# Patient Record
Sex: Male | Born: 2000 | Race: White | Hispanic: No | Marital: Single | Smoking: Never smoker
Health system: Southern US, Community
[De-identification: ages and names within clinical notes are randomized; demographics above are authoritative.]

## PROBLEM LIST (undated history)

## (undated) DIAGNOSIS — D6800 Von Willebrand disease, unspecified: Secondary | ICD-10-CM

---
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
## 2021-08-06 ENCOUNTER — Ambulatory Visit (HOSPITAL_COMMUNITY)
Admission: EM | Admit: 2021-08-06 | Discharge: 2021-08-06 | Disposition: A | Discharge status: Home / Self Care | Payer: No Payment, Other

## 2021-08-06 DIAGNOSIS — F1311 Sedative, hypnotic or anxiolytic abuse, in remission: Secondary | ICD-10-CM

## 2021-08-06 DIAGNOSIS — F1121 Opioid dependence, in remission: Secondary | ICD-10-CM
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
## 2021-08-06 NOTE — Discharge Instructions (Addendum)
Patient is instructed prior to discharge to:  Take all medications as prescribed by his/her mental healthcare provider. Report any adverse effects and or reactions from the medicines to his/her outpatient provider promptly. Keep all scheduled appointments, to ensure that you are getting refills on time and to avoid any interruption in your medication.  If you are unable to keep an appointment call to reschedule.  Be sure to follow-up with resources and follow-up appointments provided.  Patient has been instructed & cautioned: To not engage in alcohol and or illegal drug use while on prescription medicines. In the event of worsening symptoms, patient is instructed to call the crisis hotline, 911 and or go to the nearest ED for appropriate evaluation and treatment of symptoms. To follow-up with his/her primary care provider for your other medical issues, concerns and or health care needs.    Substance Abuse Treatment Resources listed Below:  [facility] - Admissions are currently completed Monday through Friday at 8am; both appointments and walk-ins are accepted.  Any individual that is a [place] resident may present for a substance abuse screening and assessment for admission.  A person may be referred by numerous sources or self-refer.   Potential clients will be screened for medical necessity and appropriateness for the program.  Clients must meet criteria for high-intensity residential treatment services.  If clinically appropriate, a client will continue with the comprehensive clinical assessment and intake process, as well as enrollment in the [facility].  Address: [address] [place], [place] [address] Admin Hours: Mon-Fri 8AM to 5PM Center Hours: 24/7 Phone: [phone] Fax: [phone]  [facility] Address: [address], [place], [place] [address] Behavioral Health Urgent Care (BHUC) Hours: 24/7 Phone:  [phone] Fax: [phone]  [facility] ([facility]): (offers outpatient therapy and intensive outpatient substance abuse therapy).  [address], [place], [place] [address] Phone: [phone]  [facility]: Offers FREE recovery skills classes, support groups, 1:1 Peer Support, and Compeer Classes. [address], [place], [place] [address] Phone: [phone] (Call to complete intake).   [facility] [address] [place], [place] [address] Phone: [phone] ext [phone] [facility] provides food, shelter and other programs and services to the homeless men of [place] through our men's program.  By offering safe shelter, three meals a day, clean clothing, Biblical counseling, financial planning, vocational training, GED/education and employment assistance, we've helped mend the shattered lives of many homeless men since opening in 1974.  We have approximately 267 beds available, with a max of 312 beds including mats for emergency situations and currently house an average of 270 men a night.  Prospective Client Check-In Information Photo ID Required (State/ Out of State/ DOC) - if photo ID is not available, clients are required to have a printout of a police/sheriff's criminal history report. Help out with chores around the Mission. No sex offender of any type (pending, charged, registered and/or any other sex related offenses) will be permitted to check in. Must be willing to abide by all rules, regulations, and policies established by the [facility]. The following will be provided - shelter, food, clothing, and biblical counseling. If you or someone you know is in need of assistance at our men's shelter in [place], [place], please call [phone] ext. [phone].  [facility]-will provide timely access to mental health services for children and adolescents (4-17) and adults    presenting in a mental health crisis. The program is designed for those who need urgent Behavioral Health or Substance Use treatment and are not experiencing a medical crisis that would typically require an emergency room visit.    [address] [place], [place] [address] Phone: [phone] [email or web address]  [facility]: Phone#: [phone]  The [facility] SA Intensive Outpatient Program (SAIOP) means structured individual and group addiction activities and services that are provided at an outpatient program designed to assist adult and adolescent consumers to begin recovery and learn skills for recovery maintenance. The [facility] SAIOP program is offered at least 3 hours a day, 3 days a week.SAIOP services shall include a structured program consisting of, but not limited to, the following services: Individual counseling and support; Group counseling and support; Family counseling, training or support; Biochemical assays to identify recent drug use (e.g., urine drug screens); Strategies for relapse prevention to include community and social support systems in treatment; Life skills; Crisis contingency planning; Disease Management; and Treatment support activities that have been adapted or specifically designed for persons with physical disabilities, or persons with co-occurring disorders of mental illness and substance abuse/dependence or mental retardation/developmental disability and substance abuse/dependence. Phone: [phone]  Address:   The [place] [facility] will also offer the following outpatient services: (Monday through Friday 8am-5pm)   Partial Hospitalization Program (PHP) Substance Abuse Intensive Outpatient Program (SA-IOP) Group Therapy Medication Management Peer Living Room We also provide (24/7):  Assessments: Our mental health clinician and providers will conduct a focused mental health evaluation, assessing for immediate  safety concerns and further mental health needs. Referral: Our team will provide resources and help connect to community based mental health treatment, when indicated, including psychotherapy, psychiatry, and other specialized behavioral health or substance use disorder services (for those not already in treatment). Transitional Care: Our team providers in person bridging and/or telephonic follow-up during the patient's transition to outpatient services.  [facility] 24-Hour Call Center: [phone] Behavioral Health Crisis Line: [phone]  

## 2021-08-06 NOTE — Progress Notes (Signed)
Gregory Orozco received his AVS, questions answered and his personal items were retrieved. He was escorted to the lobby. ?

## 2021-09-03 ENCOUNTER — Telehealth: Payer: Self-pay | Admitting: Physician Assistant

## 2021-09-03 NOTE — Telephone Encounter (Signed)
Copied from CRM [id number]. Topic: General - Deceased Patient ?>> 10-02-2021  1:00 PM Pawlus, Maxine Glenn A wrote: ?Caller from [facility] services contact the office to cancel the pts appt with [facility] due to the pt passing away FYI ?

## 2021-09-04 ENCOUNTER — Ambulatory Visit (HOSPITAL_COMMUNITY)
Admission: EM | Admit: 2021-09-04 | Discharge: 2021-09-04 | Disposition: A | Discharge status: Home / Self Care | Payer: No Payment, Other | Attending: Psychiatry | Admitting: Psychiatry

## 2021-09-04 DIAGNOSIS — Z5901 Sheltered homelessness: Secondary | ICD-10-CM | POA: Insufficient documentation

## 2021-09-04 DIAGNOSIS — F1121 Opioid dependence, in remission: Secondary | ICD-10-CM

## 2021-09-04 DIAGNOSIS — G4700 Insomnia, unspecified: Secondary | ICD-10-CM | POA: Insufficient documentation

## 2021-09-04 DIAGNOSIS — F1311 Sedative, hypnotic or anxiolytic abuse, in remission: Secondary | ICD-10-CM
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
## 2021-09-04 NOTE — Discharge Instructions (Addendum)
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
## 2021-09-05 ENCOUNTER — Encounter (HOSPITAL_COMMUNITY): Payer: Self-pay

## 2021-09-05 ENCOUNTER — Emergency Department (HOSPITAL_COMMUNITY)
Admission: EM | Admit: 2021-09-05 | Discharge: 2021-09-05 | Disposition: A | Discharge status: Home / Self Care | Payer: Medicaid Other | Attending: Emergency Medicine | Admitting: Emergency Medicine

## 2021-09-05 ENCOUNTER — Other Ambulatory Visit: Payer: Self-pay

## 2021-09-05 DIAGNOSIS — Z59812 Housing instability, housed, homelessness in past 12 months: Secondary | ICD-10-CM

## 2021-09-05 NOTE — Discharge Instructions (Addendum)
At this time there does not appear to be the presence of an emergent medical condition, however there is always the potential for conditions to change. Please read and follow the below instructions. ? ?Please return to the Emergency Department immediately for any new or worsening symptoms. ?Please be sure to follow up with your Primary Care Provider within one week regarding your visit today; please call their office to schedule an appointment even if you are feeling better for a follow-up visit. ?Please follow-up with your resource guide given to you by the behavioral health urgent care. ? ? ?Please read the additional information packets attached to your discharge summary. ? ?Do not take your medicine if  develop an itchy rash, swelling in your mouth or lips, or difficulty breathing; call 911 and seek immediate emergency medical attention if this occurs. ? ?You may review your lab tests and imaging results in their entirety on your MyChart account.  Please discuss all results of fully with your primary care provider and other specialist at your follow-up visit. ? ?Note: Portions of this text may have been transcribed using voice recognition software. Every effort was made to ensure accuracy; however, inadvertent computerized transcription errors may still be present. ? ?
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
## 2021-09-05 NOTE — ED Notes (Signed)
Discharged at triage by PA 
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
## 2021-09-06 ENCOUNTER — Ambulatory Visit: Payer: Self-pay | Admitting: Physician Assistant

## 2021-09-06 NOTE — Telephone Encounter (Signed)
MA spoke with Gregory Orozco at [facility] and confirmed patient was discharged on 4/12. Patient has been in the ED on 4/15 and Sep 28, 2022. Patient is NOT DECEASED.

## 2021-10-07 ENCOUNTER — Emergency Department (HOSPITAL_COMMUNITY): Payer: Self-pay

## 2021-10-07 ENCOUNTER — Emergency Department (HOSPITAL_COMMUNITY)
Admission: EM | Admit: 2021-10-07 | Discharge: 2021-10-07 | Disposition: A | Discharge status: Home / Self Care | Payer: Self-pay | Attending: Emergency Medicine | Admitting: Emergency Medicine

## 2021-10-07 DIAGNOSIS — W01198A Fall on same level from slipping, tripping and stumbling with subsequent striking against other object, initial encounter: Secondary | ICD-10-CM | POA: Insufficient documentation

## 2021-10-07 DIAGNOSIS — W57XXXA Bitten or stung by nonvenomous insect and other nonvenomous arthropods, initial encounter: Secondary | ICD-10-CM

## 2021-10-07 DIAGNOSIS — R251 Tremor, unspecified: Secondary | ICD-10-CM | POA: Insufficient documentation

## 2021-10-07 DIAGNOSIS — R55 Syncope and collapse: Secondary | ICD-10-CM | POA: Insufficient documentation

## 2021-10-07 DIAGNOSIS — R569 Unspecified convulsions: Secondary | ICD-10-CM | POA: Insufficient documentation

## 2021-10-07 DIAGNOSIS — S90561A Insect bite (nonvenomous), right ankle, initial encounter: Secondary | ICD-10-CM | POA: Insufficient documentation

## 2021-10-07 DIAGNOSIS — R410 Disorientation, unspecified: Secondary | ICD-10-CM | POA: Insufficient documentation

## 2021-10-07 DIAGNOSIS — R531 Weakness: Secondary | ICD-10-CM | POA: Insufficient documentation

## 2021-10-07 DIAGNOSIS — R404 Transient alteration of awareness: Secondary | ICD-10-CM

## 2021-10-07 DIAGNOSIS — R4189 Other symptoms and signs involving cognitive functions and awareness: Secondary | ICD-10-CM

## 2021-10-07 LAB — COMPREHENSIVE METABOLIC PANEL
ALT: 20 U/L (ref 0–44)
AST: 24 U/L (ref 15–41)
Albumin: 4.5 g/dL (ref 3.5–5.0)
Alkaline Phosphatase: 50 U/L (ref 38–126)
Anion gap: 8 (ref 5–15)
BUN: 12 mg/dL (ref 6–20)
CO2: 23 mmol/L (ref 22–32)
Calcium: 9.2 mg/dL (ref 8.9–10.3)
Chloride: 105 mmol/L (ref 98–111)
Creatinine, Ser: 0.83 mg/dL (ref 0.61–1.24)
GFR, Estimated: >60 mL/min (ref >60)
Glucose, Bld: 83 mg/dL (ref 70–99)
Potassium: 3.7 mmol/L (ref 3.5–5.1)
Sodium: 136 mmol/L (ref 135–145)
Total Bilirubin: 0.3 mg/dL (ref 0.3–1.2)
Total Protein: 7.3 g/dL (ref 6.5–8.1)

## 2021-10-07 LAB — CBC
HCT: 38.2 % — ABNORMAL LOW (ref 39.0–52.0)
Hemoglobin: 13.3 g/dL (ref 13.0–17.0)
MCH: 30.9 pg (ref 26.0–34.0)
MCHC: 34.8 g/dL (ref 30.0–36.0)
MCV: 88.8 fL (ref 80.0–100.0)
Platelets: 342 10*3/uL (ref 150–400)
RBC: 4.3 MIL/uL (ref 4.22–5.81)
RDW: 13.3 % (ref 11.5–15.5)
WBC: 11.4 10*3/uL — ABNORMAL HIGH (ref 4.0–10.5)
nRBC: 0 % (ref 0.0–0.2)

## 2021-10-07 MED ORDER — DOXYCYCLINE HYCLATE 100 MG PO TABS
200.0000 mg | ORAL_TABLET | Freq: Once | ORAL | Status: AC
Start: 2021-10-07 — End: 2021-10-07
  Administered 2021-10-07: 200 mg via ORAL
  Filled 2021-10-07: qty 2

## 2021-10-07 NOTE — ED Provider Notes (Signed)
[facility] Provider Note   CSN: [id number] Arrival date & time: 10/07/21  2104     History  Chief Complaint  Patient presents with   Seizures   Loss of Consciousness    Gregory JabsRyan G Orozco is a 21 y.o. male.  Patient presents via EMS, was noted to have possible seizure at work just pta today. Was stocking shelves, and was noted to fall to ground with some shaking activity for ~ 1 minute. No associated incontinence, no oral/tongue injury. EMS notes was transiently confused but was able to describe hx syncopal events in past several years 'when I dont eat much', as well as recent tick bite to right ankle. Pt notes was feeling generally weak prior to event tonight, indicating all he had to eat/drink today was one piece of pizza and was feeling very hungry. No chest pain or discomfort. No sob or unusual doe. No palpitations. No abd pain or nvd. No blood loss, rectal bleeding or melena. No dysuria or gu c/o. No headache. No neck or back pain. No extremity pain or injury.  EMS indicates glucose 101.   The history is provided by the patient, medical records and the EMS personnel.  Seizures Loss of Consciousness Associated symptoms: seizures   Associated symptoms: no chest pain, no fever, no headaches, no shortness of breath, no vomiting and no weakness       Home Medications Prior to Admission medications   Not on File      Allergies    Ibuprofen    Review of Systems   Review of Systems  Constitutional:  Negative for fever.  HENT:  Negative for sore throat.   Eyes:  Negative for redness and visual disturbance.  Respiratory:  Negative for cough and shortness of breath.   Cardiovascular:  Positive for syncope. Negative for chest pain.  Gastrointestinal:  Negative for abdominal pain, blood in stool, diarrhea and vomiting.  Genitourinary:  Negative for dysuria and flank pain.  Musculoskeletal:  Negative for back pain and neck pain.  Skin:   Negative for rash.  Neurological:  Positive for seizures. Negative for speech difficulty, weakness, numbness and headaches.  Hematological:  Does not bruise/bleed easily.  Psychiatric/Behavioral:  Negative for agitation.    Physical Exam Updated Vital Signs BP 103/70   Pulse 90   Temp 98.4 F (36.9 C) (Oral)   Resp 18   SpO2 98%  Physical Exam Vitals and nursing note reviewed.  Constitutional:      Appearance: Normal appearance. He is well-developed.  HENT:     Head: Atraumatic.     Nose: Nose normal.     Mouth/Throat:     Mouth: Mucous membranes are moist.     Pharynx: Oropharynx is clear.     Comments: No tongue or oral injury.  Eyes:     General: No scleral icterus.    Conjunctiva/sclera: Conjunctivae normal.     Pupils: Pupils are equal, round, and reactive to light.  Neck:     Vascular: No carotid bruit.     Trachea: No tracheal deviation.     Comments: No stiffness or rigidity.  Cardiovascular:     Rate and Rhythm: Normal rate and regular rhythm.     Pulses: Normal pulses.     Heart sounds: Normal heart sounds. No murmur heard.   No friction rub. No gallop.  Pulmonary:     Effort: Pulmonary effort is normal. No accessory muscle usage or respiratory distress.  Breath sounds: Normal breath sounds.  Chest:     Chest wall: No tenderness.  Abdominal:     General: Bowel sounds are normal. There is no distension.     Palpations: Abdomen is soft.     Tenderness: There is no abdominal tenderness. There is no guarding.  Genitourinary:    Comments: No cva tenderness. Musculoskeletal:        General: No swelling or tenderness.     Cervical back: Normal range of motion and neck supple. No rigidity.     Comments: CTLS spine, non tender, aligned, no step off. Good rom bil extremities without pain or focal bony tenderness.   Skin:    General: Skin is warm and dry.     Findings: No rash.     Comments: Small scab to right ankle area, no tic or fb. No other skin lesions  or rash.   Neurological:     Mental Status: He is alert.     Comments: Alert, speech clear. Motor/sens grossly intact bil. Steady gait.  Psychiatric:        Mood and Affect: Mood normal.    ED Results / Procedures / Treatments   Labs (all labs ordered are listed, but only abnormal results are displayed) Results for orders placed or performed during the hospital encounter of 10/07/21  CBC  Result Value Ref Range   WBC 11.4 (H) 4.0 - 10.5 K/uL   RBC 4.30 4.22 - 5.81 MIL/uL   Hemoglobin 13.3 13.0 - 17.0 g/dL   HCT 29.5 (L) 28.4 - 13.2 %   MCV 88.8 80.0 - 100.0 fL   MCH 30.9 26.0 - 34.0 pg   MCHC 34.8 30.0 - 36.0 g/dL   RDW 44.0 10.2 - 72.5 %   Platelets 342 150 - 400 K/uL   nRBC 0.0 0.0 - 0.2 %  Comprehensive metabolic panel  Result Value Ref Range   Sodium 136 135 - 145 mmol/L   Potassium 3.7 3.5 - 5.1 mmol/L   Chloride 105 98 - 111 mmol/L   CO2 23 22 - 32 mmol/L   Glucose, Bld 83 70 - 99 mg/dL   BUN 12 6 - 20 mg/dL   Creatinine, Ser 3.66 0.61 - 1.24 mg/dL   Calcium 9.2 8.9 - 44.0 mg/dL   Total Protein 7.3 6.5 - 8.1 g/dL   Albumin 4.5 3.5 - 5.0 g/dL   AST 24 15 - 41 U/L   ALT 20 0 - 44 U/L   Alkaline Phosphatase 50 38 - 126 U/L   Total Bilirubin 0.3 0.3 - 1.2 mg/dL   GFR, Estimated >34 >74 mL/min   Anion gap 8 5 - 15     EKG EKG Interpretation  Date/Time:  Thursday Oct 07 2021 21:07:04 EDT Ventricular Rate:  96 PR Interval:  140 QRS Duration: 111 QT Interval:  353 QTC Calculation: 447 R Axis:   90 Text Interpretation: Sinus rhythm No previous tracing Confirmed by Cathren Laine ([id number]) on 10/07/2021 10:47:29 PM  Radiology CT Head Wo Contrast  Result Date: 10/07/2021 CLINICAL DATA:  New onset seizure activity, initial encounter EXAM: CT HEAD WITHOUT CONTRAST TECHNIQUE: Contiguous axial images were obtained from the base of the skull through the vertex without intravenous contrast. RADIATION DOSE REDUCTION: This exam was performed according to the departmental  dose-optimization program which includes automated exposure control, adjustment of the mA and/or kV according to patient size and/or use of iterative reconstruction technique. COMPARISON:  None Available. FINDINGS: Brain: No evidence of acute  infarction, hemorrhage, hydrocephalus, extra-axial collection or mass lesion/mass effect. Atrophic changes are noted. Vascular: No hyperdense vessel or unexpected calcification. Skull: Normal. Negative for fracture or focal lesion. Sinuses/Orbits: No acute finding. Other: None. IMPRESSION: Mild atrophic changes without acute abnormality. Electronically Signed   By: Alcide Clever M.D.   On: 10/07/2021 21:59    Procedures Procedures    Medications Ordered in ED Medications - No data to display  ED Course/ Medical Decision Making/ A&P                           Medical Decision Making Problems Addressed: Tick bite of right ankle, initial encounter: acute illness or injury Unresponsive episode: acute illness or injury with systemic symptoms that poses a threat to life or bodily functions  Amount and/or Complexity of Data Reviewed Independent Historian: EMS    Details: hx External Data Reviewed: notes. Labs: ordered. Decision-making details documented in ED Course. Radiology: ordered and independent interpretation performed. Decision-making details documented in ED Course. ECG/medicine tests: ordered and independent interpretation performed. Decision-making details documented in ED Course.  Risk Prescription drug management.   Iv ns. Continuous pulse ox and cardiac monitoring. Labs ordered/sent. Imaging ordered.   Reviewed nursing notes and prior charts for additional history. External reports reviewed. Additional history from: EMS.   Cardiac monitor: sinus rhythm, rate 90.  Labs reviewed/interpreted by me - hgb normal, chem normal.   CT reviewed/interpreted by me - no hem.  Po fluids/food.   Given hx syncope, very little po intake today, pt  feeling hungry/faint, suspect most likely syncopal event. Will also refer to neurology for outpatient f/u, further eval.   For tick bite, doxy po.   Recheck pt, has eaten/drank, no c/o, no pain, no faintness.   Pt currently appears stable for d/c.  Return precautions provided.           Final Clinical Impression(s) / ED Diagnoses Final diagnoses:  None    Rx / DC Orders ED Discharge Orders     None         Cathren Laine, MD 10/07/21 2255
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
## 2021-10-07 NOTE — Discharge Instructions (Signed)
It was our pleasure to provide your ER care today - we hope that you feel better.  Drink plenty of fluids/stay well hydrated, eat meals regularly.   Follow up with primary care doctor and neurologist in the coming week - call office to arrange appointment - no driving until cleared to do so by your doctor/neurologist.    Return to ER if worse, new symptoms, fevers, new/severe pain, chest pain, trouble breathing, fainting, seizure, or other concern.

## 2021-11-29 ENCOUNTER — Ambulatory Visit (HOSPITAL_COMMUNITY)
Admission: EM | Admit: 2021-11-29 | Discharge: 2021-11-29 | Disposition: A | Discharge status: Home / Self Care | Payer: No Payment, Other | Attending: Psychiatry | Admitting: Psychiatry

## 2021-11-29 ENCOUNTER — Encounter (HOSPITAL_COMMUNITY): Payer: Self-pay | Admitting: Student

## 2021-11-29 DIAGNOSIS — Z5900 Homelessness unspecified: Secondary | ICD-10-CM | POA: Insufficient documentation

## 2021-11-29 DIAGNOSIS — F1190 Opioid use, unspecified, uncomplicated: Secondary | ICD-10-CM | POA: Diagnosis present

## 2021-11-29 DIAGNOSIS — F1210 Cannabis abuse, uncomplicated: Secondary | ICD-10-CM | POA: Diagnosis present

## 2021-11-29 DIAGNOSIS — F17200 Nicotine dependence, unspecified, uncomplicated: Secondary | ICD-10-CM | POA: Diagnosis present

## 2021-11-29 DIAGNOSIS — F1321 Sedative, hypnotic or anxiolytic dependence, in remission: Secondary | ICD-10-CM | POA: Diagnosis present

## 2021-11-29 DIAGNOSIS — Z9151 Personal history of suicidal behavior: Secondary | ICD-10-CM | POA: Insufficient documentation

## 2021-11-29 NOTE — Discharge Instructions (Addendum)
To see which pharmacy near you is the CHEAPEST for certain medications, please use GoodRx. It is free website and has a free phone app.      Also consider looking at [facility] $4.00 or [facility] $7.00 prescription list. Both are free to view if googled "[facility] prescription" and "[facility] $7 prescription". These are set prices, no insurance required.   For issues with sleep, please use this free app for insomnia called CBT-I. Let your doctors and therapists know so they can help with extra tips and tricks or for guidance and accountability. NO ADDS on the app.     For therapy outside the hospital, please ask for these specific types of therapy: CBT   Please make regular appointments with an outpatient psychiatrist and other doctors once you leave the hospital.    Suicide hotline: 988 Emergency: 911   See below for additional and specific details.    Medication Management and Therapy for No Insurance/IPRS Based on observation and your report, outpatient services with psychiatry and therapy have been recommended.  It is imperative to your mental health that seek out services 7-10 day from your day of discharge to prevent another crisis. A list of referrals has been provided below based on your needs and recommendations.  You are not limited to the list provided.  In case of an urgent crisis, you may contact the Mobile Crisis Unit with [facility] at [phone].   You have the option to call 211 for assistance in finding additional mental health agencies if these do not meet your needs.   For your behavioral health needs you are advised to follow up with [facility] at your earliest opportunity:            [facility]      [address], SECOND FLOOR      [place], [place] [address]      [phone]       They offer psychiatry/medication management and therapy.  New patients are seen in their walk-in clinic.  Walk-in hours are  Monday, Wednesday, Thursday and Friday from 8:00 am - 11:00 am for psychiatry, and Monday and Wednesday from 8:00 am - 11:00 am for therapy.  Walk-in patients are seen on a first come, first served basis, so try to arrive as early as possible for the best chance of being seen the same day.  BE SURE TO TAKE THE ELEVATOR TO THE SECOND FLOOR.  Please note that to be eligible for services you must bring an ID or a piece of mail with your name and a [facility] address.          [facility]      [address]      [place], [place] [address]      [phone]        [facility]      [address] [place], [place], [address]      [phone] phone      [phone] fax        [facility]      [address]      [place], [place], [address]      [phone] phone to set up initial appointment       [facility]     [address]  [place], [place], [address]     [phone] phone     [phone] fax
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
## 2021-11-29 NOTE — ED Triage Notes (Signed)
Pt presents to [facility] seeking detox. Pt states he was referred from [facility]. Pt states he has not used any opiods in over 1 week. Pt denies any withdrawal symptoms. Pt denies SI/HI, NSSIB, and AVH.

## 2021-11-29 NOTE — BH Assessment (Signed)
LCSW Progress Note   Per Princess Bruins, DO, this pt does not require psychiatric hospitalization at this time.  Pt is psychiatrically cleared.  Discharge instructions include several resources for outpatient therapy and medication management.  EDP Princess Bruins, DO, has been notified.  Hansel Starling, MSW, LCSW [facility] [phone] or [phone]

## 2023-06-14 IMAGING — CT CT HEAD W/O CM
3 series · 16 of 47 positions shown, 19 images · non-contrast
Comparison: None Available.

CLINICAL DATA: New onset seizure activity, initial encounter



[Series 3: head 5.0 h30s · axial · 0.47mm/px · z∈[-74,+71]mm · 10 of 35 slices shown, 13 images]
[im 3/35  brain]
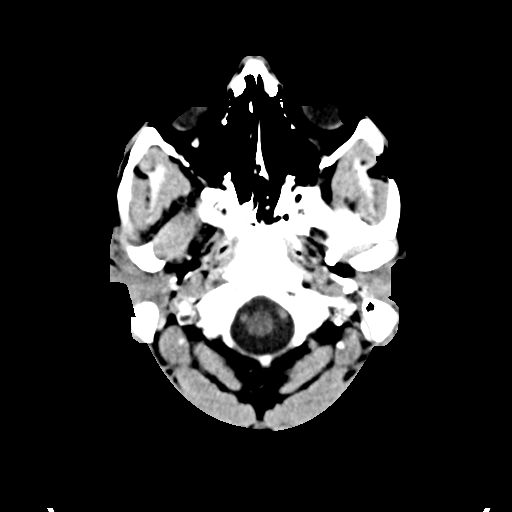
[im 3/35  bone]
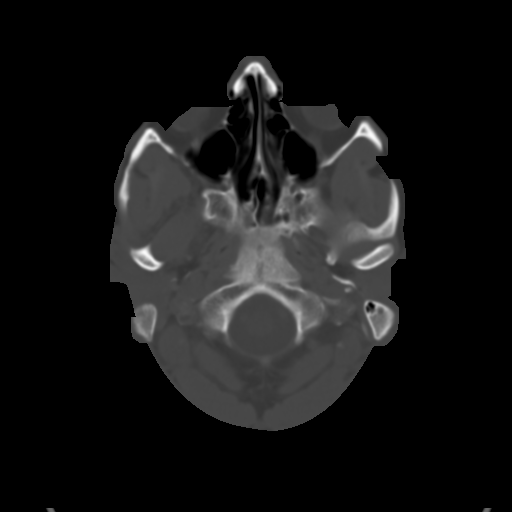
[im 6/35  brain]
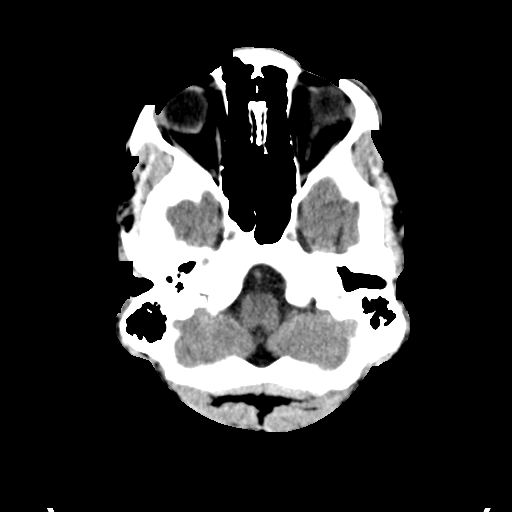
[im 10/35  brain]
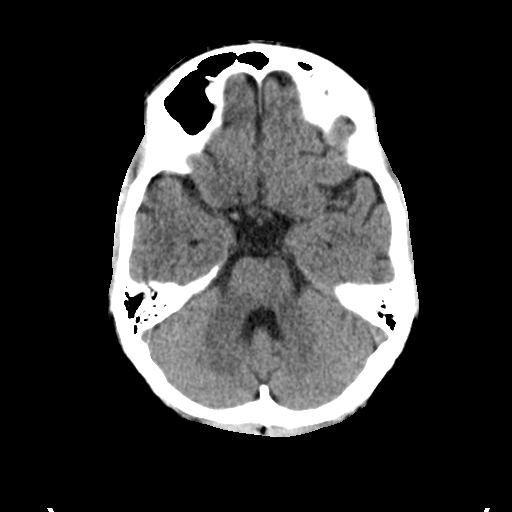
[im 12/35  brain]
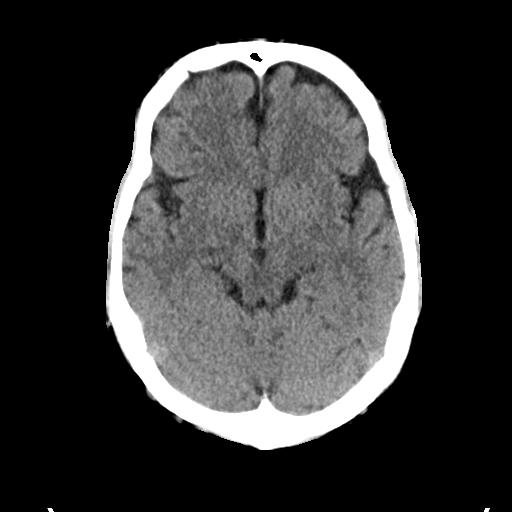
[im 16/35  brain]
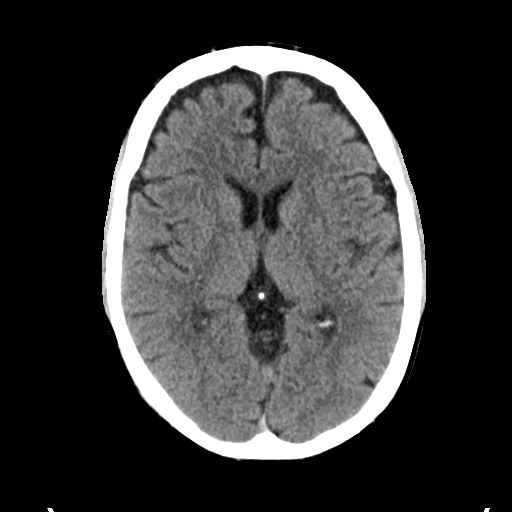
[im 16/35  bone]
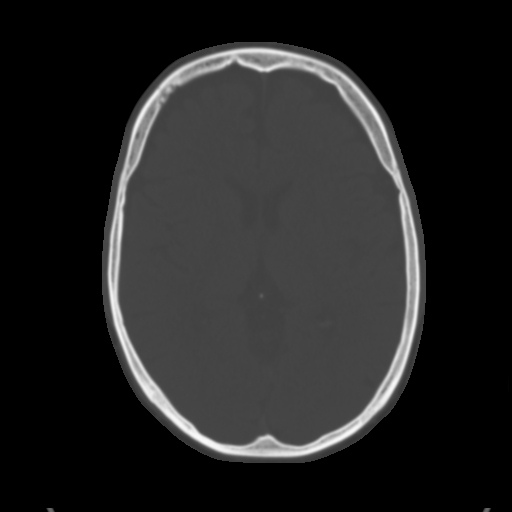
[im 19/35  brain]
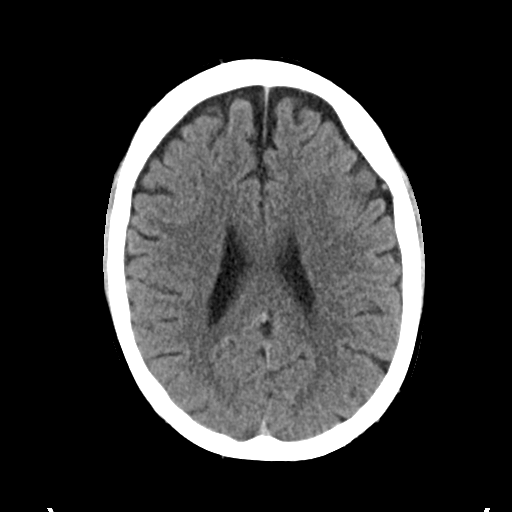
[im 23/35  brain]
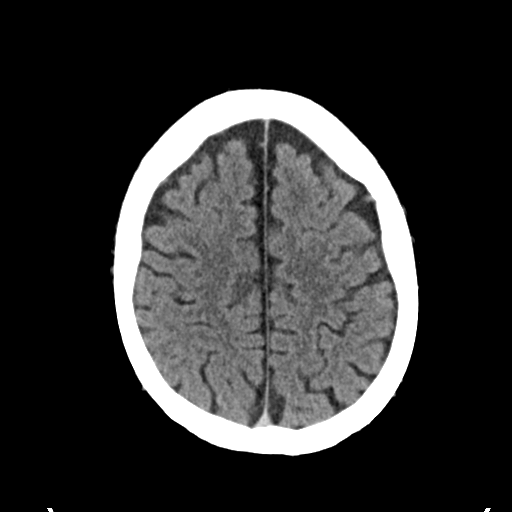
[im 26/35  brain]
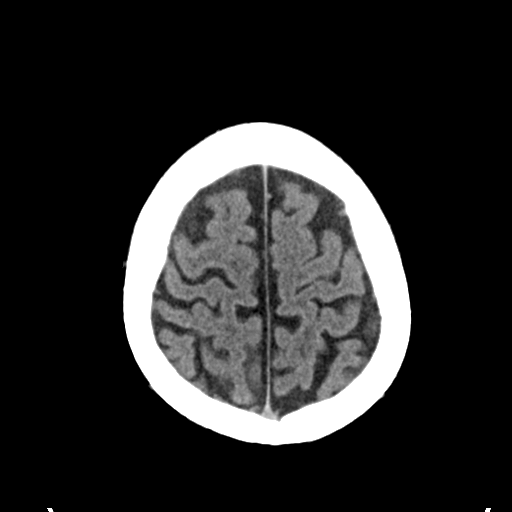
[im 29/35  brain]
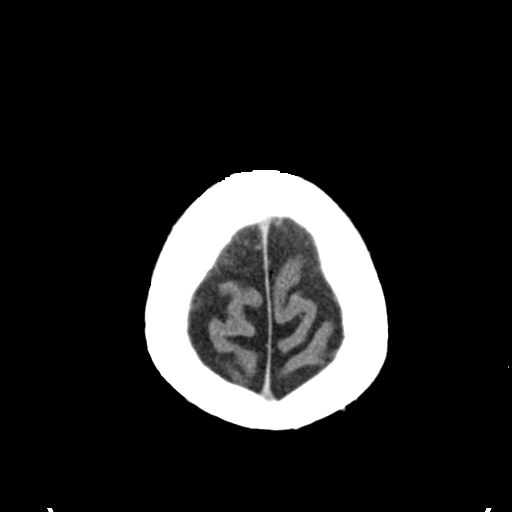
[im 29/35  bone]
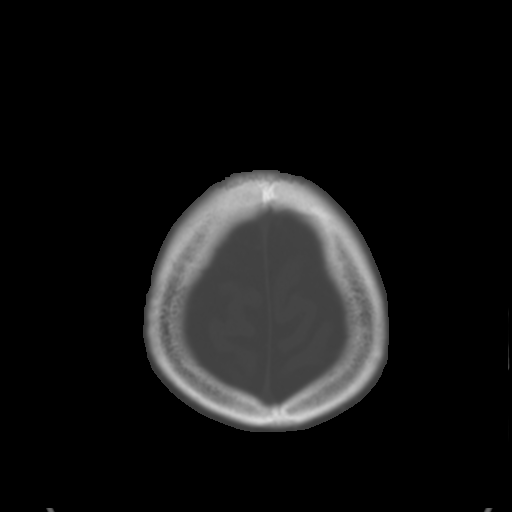
[im 32/35  brain]
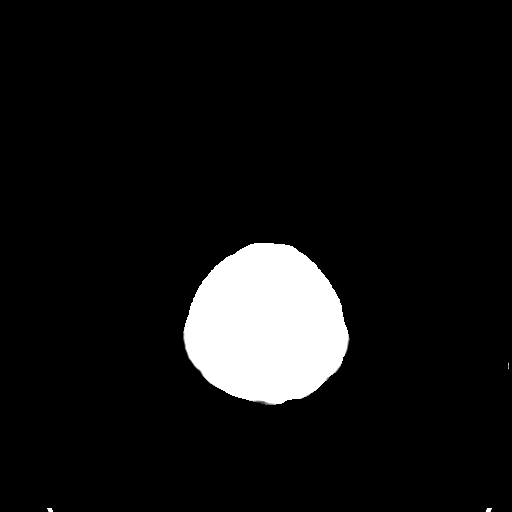

[Series 5: head 3.0 mpr cor · coronal · 0.35mm/px · 3 of 75 slices shown]
[im 25/75  brain]
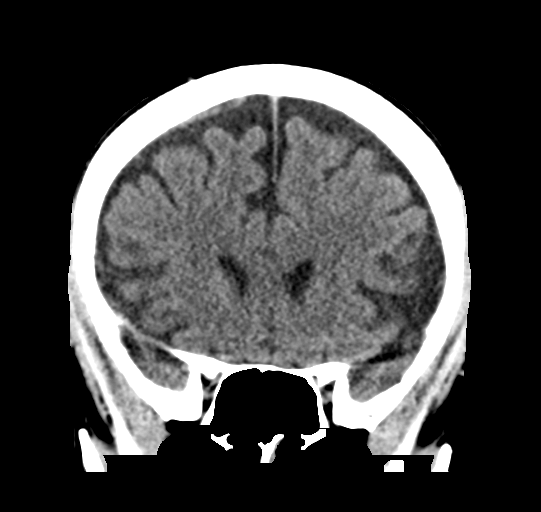
[im 33/75  brain]
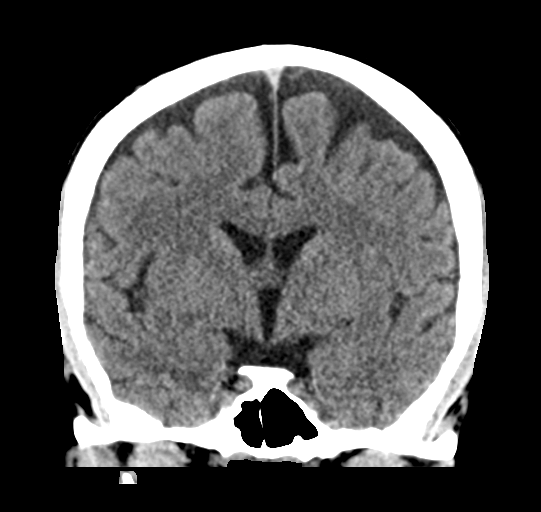
[im 42/75  brain]
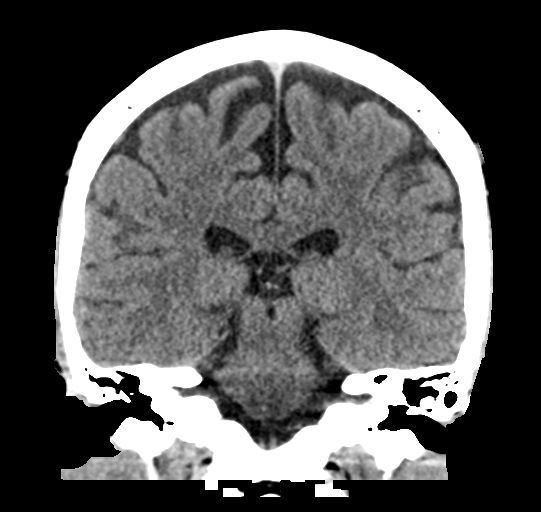

[Series 6: head 3.0 mpr sag · sagittal · 0.35mm/px · 3 of 64 slices shown]
[im 22/64  brain]
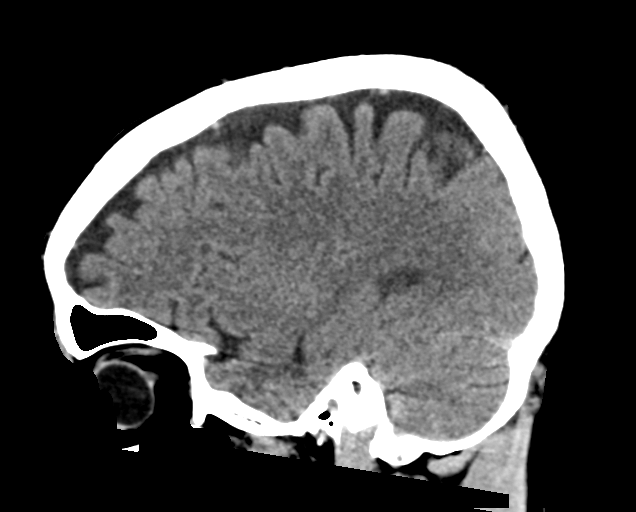
[im 32/64  brain]
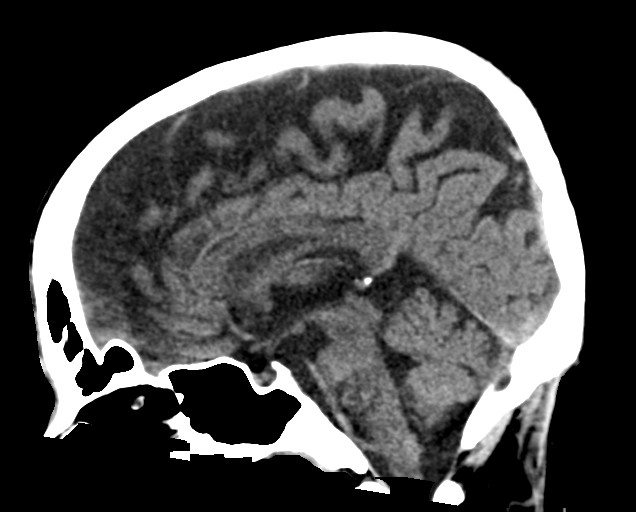
[im 43/64  brain]
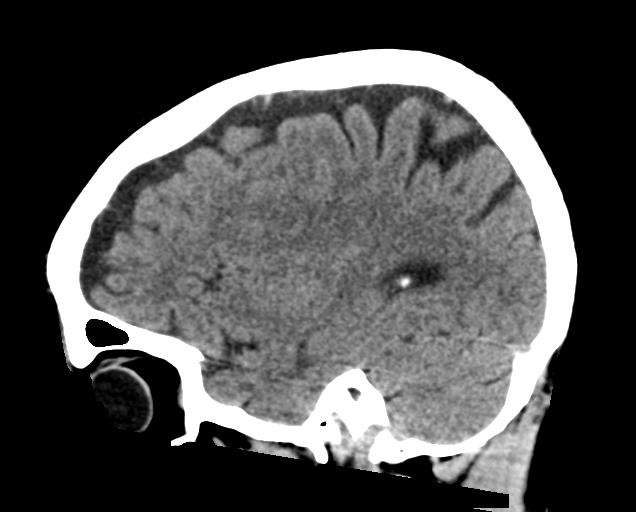

[16 of 47 positions shown; findings below may reference images not displayed]

FINDINGS: Brain: No evidence of acute infarction, hemorrhage, hydrocephalus,
extra-axial collection or mass lesion/mass effect. Atrophic changes
are noted.

Vascular: No hyperdense vessel or unexpected calcification.

Skull: Normal. Negative for fracture or focal lesion.

Sinuses/Orbits: No acute finding.

Other: None.
IMPRESSION: Mild atrophic changes without acute abnormality.
